# Patient Record
Sex: Male | Born: 1960 | Race: White | Hispanic: No | Marital: Married | Smoking: Never smoker
Health system: Southern US, Community
[De-identification: ages and names within clinical notes are randomized; demographics above are authoritative.]

## PROBLEM LIST (undated history)

## (undated) DIAGNOSIS — K219 Gastro-esophageal reflux disease without esophagitis: Secondary | ICD-10-CM

## (undated) DIAGNOSIS — L309 Dermatitis, unspecified: Secondary | ICD-10-CM

## (undated) HISTORY — DX: Dermatitis, unspecified: L30.9

## (undated) HISTORY — PX: KNEE ARTHROSCOPY: SUR90

## (undated) HISTORY — PX: HERNIA REPAIR: SHX51

## (undated) HISTORY — DX: Gastro-esophageal reflux disease without esophagitis: K21.9

---
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
## 2007-08-05 ENCOUNTER — Ambulatory Visit (HOSPITAL_BASED_OUTPATIENT_CLINIC_OR_DEPARTMENT_OTHER): Admission: RE | Admit: 2007-08-05 | Discharge: 2007-08-05 | Payer: Self-pay | Admitting: Orthopedic Surgery

## 2009-05-10 HISTORY — PX: INGUINAL HERNIA REPAIR: SUR1180

## 2010-11-13 NOTE — Op Note (Signed)
NAME:  Rodney Weaver, Rodney Weaver              ACCOUNT NO.:  [id number]   MEDICAL RECORD NO.:  [id number]          PATIENT TYPE:  AMB   LOCATION:  DSC                          FACILITY:  [facility]   PHYSICIAN:  Mila Homer. Sherlean Foot, M.D. DATE OF BIRTH:  01/29/1961   DATE OF PROCEDURE:  08/05/2007  DATE OF DISCHARGE:                               OPERATIVE REPORT   SURGEON:  Mila Homer. Sherlean Foot, M.D.   ASSISTANT:  None.   ANESTHESIA:  MAC.   PREOPERATIVE DIAGNOSIS:  Right knee medial meniscus tear and plica  syndrome.   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   PROCEDURE:  Right knee arthroscopy, partial medial meniscectomy, plica  resection.   INDICATIONS FOR PROCEDURE:  The patient is a 50 year old white male with  mechanical symptoms and MRI evidence of the medial meniscus tear.  Informed consent was obtained.   DESCRIPTION OF PROCEDURE:  The patient was laid supine and administered  MAC anesthesia.  Right leg was prepped and draped in usual sterile  fashion.  Inferolateral and inferomedial portals were created with a #11  blade, blunt trocar, and cannula.  Diagnostic arthroscopy revealed no  chondromalacia in the joint at all.  There was a large peripatellar  hyperemic synovitic plica.  This was debrided with the [facility]  shaver, and ArthroCare debridement wand was used to cauterized bleeding  vessels.  Went into flexion, and the ACL and PCL were normal.  The  medial compartment looked excellent.  I then probed the undersurface of  the meniscus, found meniscus tear about mid body on the undersurface,  certainly grade 3 tearing.  I used a straight basket forceps and a [place] shaver to perform a partial medial meniscectomy of approximately  10-15% of the meniscus.  I then went into figure-four position, and  lateral part was completely normal.  I then lavaged and closed with 4-0  nylon sutures, dressed with Xeroform, dressing, sponges, sterile  Webril,  and Ace wrap.   COMPLICATIONS:  None.   DRAINS:  None.           ______________________________  Mila Homer. Sherlean Foot, M.D.     SDL/MEDQ  D:  08/05/2007  T:  08/06/2007  Job:  [id number]

## 2011-03-22 LAB — BASIC METABOLIC PANEL
BUN: 7
CO2: 28
Chloride: 102
Glucose, Bld: 101 — ABNORMAL HIGH
Potassium: 4.3

## 2011-03-22 LAB — POCT HEMOGLOBIN-HEMACUE: Hemoglobin: 14.3

## 2011-11-20 HISTORY — PX: AV FISTULA REPAIR: SHX563

## 2016-09-24 ENCOUNTER — Ambulatory Visit: Payer: Self-pay | Admitting: Pediatrics

## 2016-10-07 ENCOUNTER — Encounter: Payer: Self-pay | Admitting: Pediatrics

## 2016-10-07 ENCOUNTER — Ambulatory Visit (INDEPENDENT_AMBULATORY_CARE_PROVIDER_SITE_OTHER): Payer: BLUE CROSS/BLUE SHIELD | Admitting: Pediatrics

## 2016-10-07 VITALS — BP 158/104 | HR 71 | Temp 98.3°F | Resp 16 | Ht 67.91 in | Wt 212.3 lb

## 2016-10-07 DIAGNOSIS — J4530 Mild persistent asthma, uncomplicated: Secondary | ICD-10-CM | POA: Diagnosis not present

## 2016-10-07 DIAGNOSIS — J3089 Other allergic rhinitis: Secondary | ICD-10-CM | POA: Diagnosis not present

## 2016-10-07 DIAGNOSIS — L2089 Other atopic dermatitis: Secondary | ICD-10-CM | POA: Diagnosis not present

## 2016-10-07 DIAGNOSIS — L500 Allergic urticaria: Secondary | ICD-10-CM

## 2016-10-07 DIAGNOSIS — K219 Gastro-esophageal reflux disease without esophagitis: Secondary | ICD-10-CM

## 2016-10-07 DIAGNOSIS — Z79899 Other long term (current) drug therapy: Secondary | ICD-10-CM | POA: Diagnosis not present

## 2016-10-07 DIAGNOSIS — I10 Essential (primary) hypertension: Secondary | ICD-10-CM | POA: Diagnosis not present

## 2016-10-07 MED ORDER — MONTELUKAST SODIUM 10 MG PO TABS: 10.0000 mg | ORAL_TABLET | Freq: Every day | ORAL | 5 refills | Status: AC

## 2016-10-07 MED ORDER — ALBUTEROL SULFATE HFA 108 (90 BASE) MCG/ACT IN AERS: 2.0000 | INHALATION_SPRAY | RESPIRATORY_TRACT | 1 refills | Status: AC | PRN

## 2016-10-07 MED ORDER — FAMOTIDINE 20 MG PO TABS: 20.0000 mg | ORAL_TABLET | Freq: Two times a day (BID) | ORAL | 5 refills | Status: AC

## 2016-10-07 MED ORDER — AZELASTINE-FLUTICASONE 137-50 MCG/ACT NA SUSP: NASAL | 5 refills | Status: AC

## 2016-10-07 NOTE — Progress Notes (Signed)
[address] [place] [address] Dept: [phone]  New Patient Note  Patient ID: Rodney Weaver, male    DOB: 03-01-1961  Age: 56 y.o. MRN: [id number] Date of Office Visit: 10/07/2016 Referring provider: Cheron Schaumann, MD [address] [place], [place] [address]    Chief Complaint: Asthma (as a child. hasn't used inhalers for a long timeA); Angioedema (intermittently for severals years. most recent episode of swelling started this past Friday started off with hand swelling and yesterday his feet started swelling.); and Advice Only (2cd opinion. presently on immunotherapy at [facility] health care. Hasn't had an injection since December)  HPI Rodney Weaver presents for evaluation of angioedema since December 2017. He did have one episode of swelling of his tongue. He has a history of itching before the swelling occurs. He may have had similar symptoms in childhood. He has been on allergy injections for over 10 years because of asthma and rhinitis. He has a history of eczema for 20 years. He has aggravation of his nasal congestion and cough with exposure to dust and cigarette smoke. 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 During the past 3 days he has had chills and some sweating but no actual fever. He has had some angioedema and itching during the past 3 days. Azelastine nasal spray has not been very helpful. He uses Dymista if needed for stuffy nose.  Review of Systems  Constitutional:       Chills and sweating for 3 days but no fever  HENT:       Nasal congestion for several years. On allergy injections for over 10 years  Eyes: Negative.   Respiratory:       History of a severe asthma as a child  Cardiovascular:       Hypertension  Gastrointestinal:       Heartburn at times  Genitourinary: Negative.   Musculoskeletal: Negative.   Skin:       Angioedema and itching of his skin worse  since December 2017. History of  eczema  Neurological: Negative.   Endo/Heme/Allergies:       No diabetes. He has Hypothyroidism  Psychiatric/Behavioral: Negative.     Outpatient Encounter Prescriptions as of 10/07/2016  Medication Sig  . Ascorbic Acid (VITAMIN C PO) Take 500 mg by mouth.  . Azelastine-Fluticasone 137-50 MCG/ACT SUSP 1 spray by Each Nare route daily as needed.  . cetirizine (ZYRTEC) 10 MG tablet Take 10 mg by mouth.  . clindamycin (CLEOCIN T) 1 % external solution   . clobetasol (TEMOVATE) 0.05 % external solution   . diphenhydrAMINE (BENADRYL) 25 mg capsule Take 25 mg by mouth.  . EPINEPHrine (EPIPEN 2-PAK) 0.3 mg/0.3 mL IJ SOAJ injection   . famotidine (PEPCID) 20 MG tablet Take 20 mg by mouth.  . Garlic 1000 MG CAPS Take by mouth.  . levothyroxine (SYNTHROID, LEVOTHROID) 100 MCG tablet Take by mouth.  . Multiple Vitamins-Minerals (MULTIVITAMIN ADULTS PO) Take by mouth.  . Nebivolol HCl 20 MG TABS Take 20 mg by mouth.  . sildenafil (VIAGRA) 100 MG tablet Take one tablet by mouth 30 minutes prior to sex.  . valsartan (DIOVAN) 320 MG tablet Take 320 mg by mouth.  Marland Kitchen albuterol (PROVENTIL HFA) 108 (90 Base) MCG/ACT inhaler Inhale 2 puffs into the lungs every 4 (four) hours as needed for wheezing or shortness of breath.  . Azelastine-Fluticasone 137-50 MCG/ACT SUSP 1 spray per nostril 2 times a  day  . famotidine (PEPCID) 20 MG tablet Take 1 tablet (20 mg total) by mouth 2 (two) times daily.  . montelukast (SINGULAIR) 10 MG tablet Take 1 tablet (10 mg total) by mouth at bedtime.   No facility-administered encounter medications on file as of 10/07/2016.      Drug Allergies:  Allergies  Allergen Reactions  . Benzonatate Swelling  . Clindamycin Swelling    Facial Swelling with Oral Medication Only Topical Clindamycin is Tolerated by the Patient  . Desloratadine Swelling  . Diltiazem Swelling  . Levofloxacin Swelling  . Valsartan-Hydrochlorothiazide Swelling  . Ace Inhibitors     Arb inhibitors causes  swelling.  . Diovan [Valsartan]     swelling  . Clonidine Nausea Only    Family History: Square's family history is not on file.. Family history is positive for allergic rhinitis and eczema but negative for asthma, sinus problems, angioedema, hives, food allergies, chronic bronchitis or emphysema.  Social and environmental. There is a dog in the home. He is not exposed to cigarette smoking. He has never smoked cigarettes. 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  Physical Exam: BP (!) 158/104   Pulse 71   Temp 98.3 F (36.8 C) (Oral)   Resp 16   Ht 5' 7.91" (1.725 m)   Wt 212 lb 4.9 oz (96.3 kg)   SpO2 98%   BMI 32.36 kg/m    Physical Exam  Constitutional: He is oriented to person, place, and time. He appears well-developed and well-nourished.  HENT:  Eyes normal. Ears normal. Nose mild swelling of turbinates. Pharynx normal.  Neck: Neck supple. No thyromegaly present.  Cardiovascular:  S1 and S2 normal no murmurs  Pulmonary/Chest:  Clear to percussion and auscultation  Abdominal: Soft. There is no tenderness (no hepatosplenomegaly).  Lymphadenopathy:    He has no cervical adenopathy.  Neurological: He is alert and oriented to person, place, and time.  Skin:  Mild swelling in the lower eyelids. The swollen area in the left wrist. Dermographia  Psychiatric: He has a normal mood and affect. His behavior is normal. Thought content normal.  Vitals reviewed.   Diagnostics: FVC 2.87 L FEV1 2.80 L. Predicted FVC 4.66 L predicted FEV1 3.60 L. After albuterol 2 puffs FVC 4.43 L FEV1 3.63 L-this shows a moderate reduction in the forced vital capacity. He has had asthma since early childhood which was quite severe. He has mild airway obstruction.   Allergy skin testing showed a slight reactivity to a common indoor mold. Skin testing to foods was negative   Assessment  Assessment and Plan: 1. Mild persistent asthma without  complication   2. Other allergic rhinitis   3. Allergic urticaria   4. Flexural atopic dermatitis   5. Essential hypertension   6. Current use of beta blocker   7. Gastroesophageal reflux disease without esophagitis     Meds ordered this encounter  Medications  . montelukast (SINGULAIR) 10 MG tablet    Sig: Take 1 tablet (10 mg total) by mouth at bedtime.    Dispense:  30 tablet    Refill:  5    For cough or wheeze.  Marland Kitchen albuterol (PROVENTIL HFA) 108 (90 Base) MCG/ACT inhaler    Sig: Inhale 2 puffs into the lungs every 4 (four) hours as needed for wheezing or shortness of breath.    Dispense:  1 Inhaler    Refill:  1  .  famotidine (PEPCID) 20 MG tablet    Sig: Take 1 tablet (20 mg total) by mouth 2 (two) times daily.    Dispense:  60 tablet    Refill:  5    For reflux  . Azelastine-Fluticasone 137-50 MCG/ACT SUSP    Sig: 1 spray per nostril 2 times a day    Dispense:  1 Bottle    Refill:  5    For runny nose. Please keep rx on file. Pt. Will call when needed.    Patient Instructions  Environmental control of dust and mold Zyrtec 10 mg once or twice a day for 4 itching Dymista 1 spray per nostril twice a day if needed for stuffy nose or sinus headache Montelukast  10 mg once a day for coughing or wheezing Proventil 2 puffs every 4 hours if needed for wheezing or coughing spells Continue Pepcid 20 mg twice a day for heartburn. May help the itching Add  prednisone 10 mg twice a day for 4 days, 10 mg on the fifth day to bring your allergic reaction under control Call me if you're not doing well on this treatment plan Stop allergy injections  If you have an allergic reaction take Benadryl 50 mg every 4 hours, and if you have life-threatening symptoms inject  with EpiPen 0.3 mg. Then write what you had to eat or drink in the previous 4 hours  Call your family doctor to change your blood pressure medications from valsartan to non-ACE inhibitor and a non-ARB inhibitor. In patients  who have had angioedema from ACE inhibitor's, about 10% can have angioedema from an ARB inhibitor   Return in about 4 weeks (around 11/04/2016).   Thank you for the opportunity to care for this patient.  Please do not hesitate to contact me with questions.  Tonette Bihari, M.D.  [facility] [address] [place], [place] [address] [phone]

## 2016-10-07 NOTE — Patient Instructions (Addendum)
Environmental control of dust and mold Zyrtec 10 mg once or twice a day for 4 itching Dymista 1 spray per nostril twice a day if needed for stuffy nose or sinus headache Montelukast  10 mg once a day for coughing or wheezing Proventil 2 puffs every 4 hours if needed for wheezing or coughing spells Continue Pepcid 20 mg twice a day for heartburn. May help the itching Add  prednisone 10 mg twice a day for 4 days, 10 mg on the fifth day to bring your allergic reaction under control Call me if you're not doing well on this treatment plan Stop allergy injections  If you have an allergic reaction take Benadryl 50 mg every 4 hours, and if you have life-threatening symptoms inject  with EpiPen 0.3 mg. Then write what you had to eat or drink in the previous 4 hours  Call your family doctor to change your blood pressure medications from valsartan to non-ACE inhibitor and a non-ARB inhibitor. In patients who have had angioedema from ACE inhibitor's, about 10% can have angioedema from an ARB inhibitor

## 2016-10-18 ENCOUNTER — Telehealth: Payer: Self-pay | Admitting: *Deleted

## 2016-10-18 NOTE — Telephone Encounter (Signed)
Pt states that he hasn't had any problems since his last ov until today. His tongue is swelling and he is not sure what's causing it. Would like a return call today

## 2016-10-18 NOTE — Telephone Encounter (Signed)
Tried to leave message for pt, got his voicemail box that was full and couldn't receive messages

## 2016-10-25 ENCOUNTER — Telehealth: Payer: Self-pay

## 2016-10-25 NOTE — Telephone Encounter (Signed)
Called pt.to let him know to increase his zyrtec to 20 mg twice daily and to stay on the bystolic. Told pt. Even though he has stopped the diovan he still can have breakthrough swelling episodes for months to years  after stopping the medication. Pt. Declined to come in next week with Dr. Beaulah Dinning bc he has an appointment already With him on May 21st.

## 2016-10-25 NOTE — Telephone Encounter (Signed)
Pt. Calling to let us know he is still having swelling issues since his visit with Dr. Beaulah Dinning. On April 9th. Pt. States yesterday his bottom lip was swelling and now the left side of his tongue is swelling. Dr. Sebastian Ache had him stop his Diovan but pt. States he is still taking bystolic but did not see on his med list. Please advise

## 2016-10-25 NOTE — Telephone Encounter (Signed)
Reviewed note. He should probably come back and see Dr. Leonard Schwartz. I would recommend to increase his cetirizine to  BID. He should remain on the Bystolic. 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 Even after stopping the medication, you can still have breakthrough swelling episodes for months to years after stopping the medication. He should follow up next week with Dr. B if he can.    Malachi Bonds, MD FAAAAI [facility]

## 2016-10-25 NOTE — Telephone Encounter (Signed)
Told pt. To increase his zyrtec  twice daily and to continue on his bystolic told pt. He may have intermittent swelling episodes for months to years after stopping his diovan per Dr. Dellis Anes. Pt. Declined an appointment this up coming week with Dr. Beaulah Dinning bc he has an appointment already with Dr. Beaulah Dinning on May 21.

## 2016-10-25 NOTE — Telephone Encounter (Signed)
Lm for pt to call us back about this matter 

## 2016-11-18 ENCOUNTER — Ambulatory Visit: Payer: BLUE CROSS/BLUE SHIELD | Admitting: Pediatrics

## 2018-02-13 ENCOUNTER — Other Ambulatory Visit: Payer: Self-pay | Admitting: Chiropractic Medicine

## 2018-02-13 DIAGNOSIS — M25552 Pain in left hip: Secondary | ICD-10-CM

## 2021-12-12 ENCOUNTER — Other Ambulatory Visit: Payer: Self-pay | Admitting: Chiropractic Medicine

## 2021-12-12 ENCOUNTER — Ambulatory Visit
Admission: RE | Admit: 2021-12-12 | Discharge: 2021-12-12 | Disposition: A | Discharge status: Home / Self Care | Payer: 59 | Source: Ambulatory Visit | Attending: Chiropractic Medicine | Admitting: Chiropractic Medicine

## 2021-12-12 DIAGNOSIS — M25612 Stiffness of left shoulder, not elsewhere classified: Secondary | ICD-10-CM

## 2021-12-12 DIAGNOSIS — M25611 Stiffness of right shoulder, not elsewhere classified: Secondary | ICD-10-CM

## 2023-05-25 IMAGING — CR DG SHOULDER 2+V*L*
3 series · 3 of 3 positions shown · non-contrast
Comparison: None Available.

CLINICAL DATA: Decreased range of motion and persistent pain of the
bilateral shoulders.

EXAM:
LEFT SHOULDER - 2+ VIEW

[w shoulder grashey left]
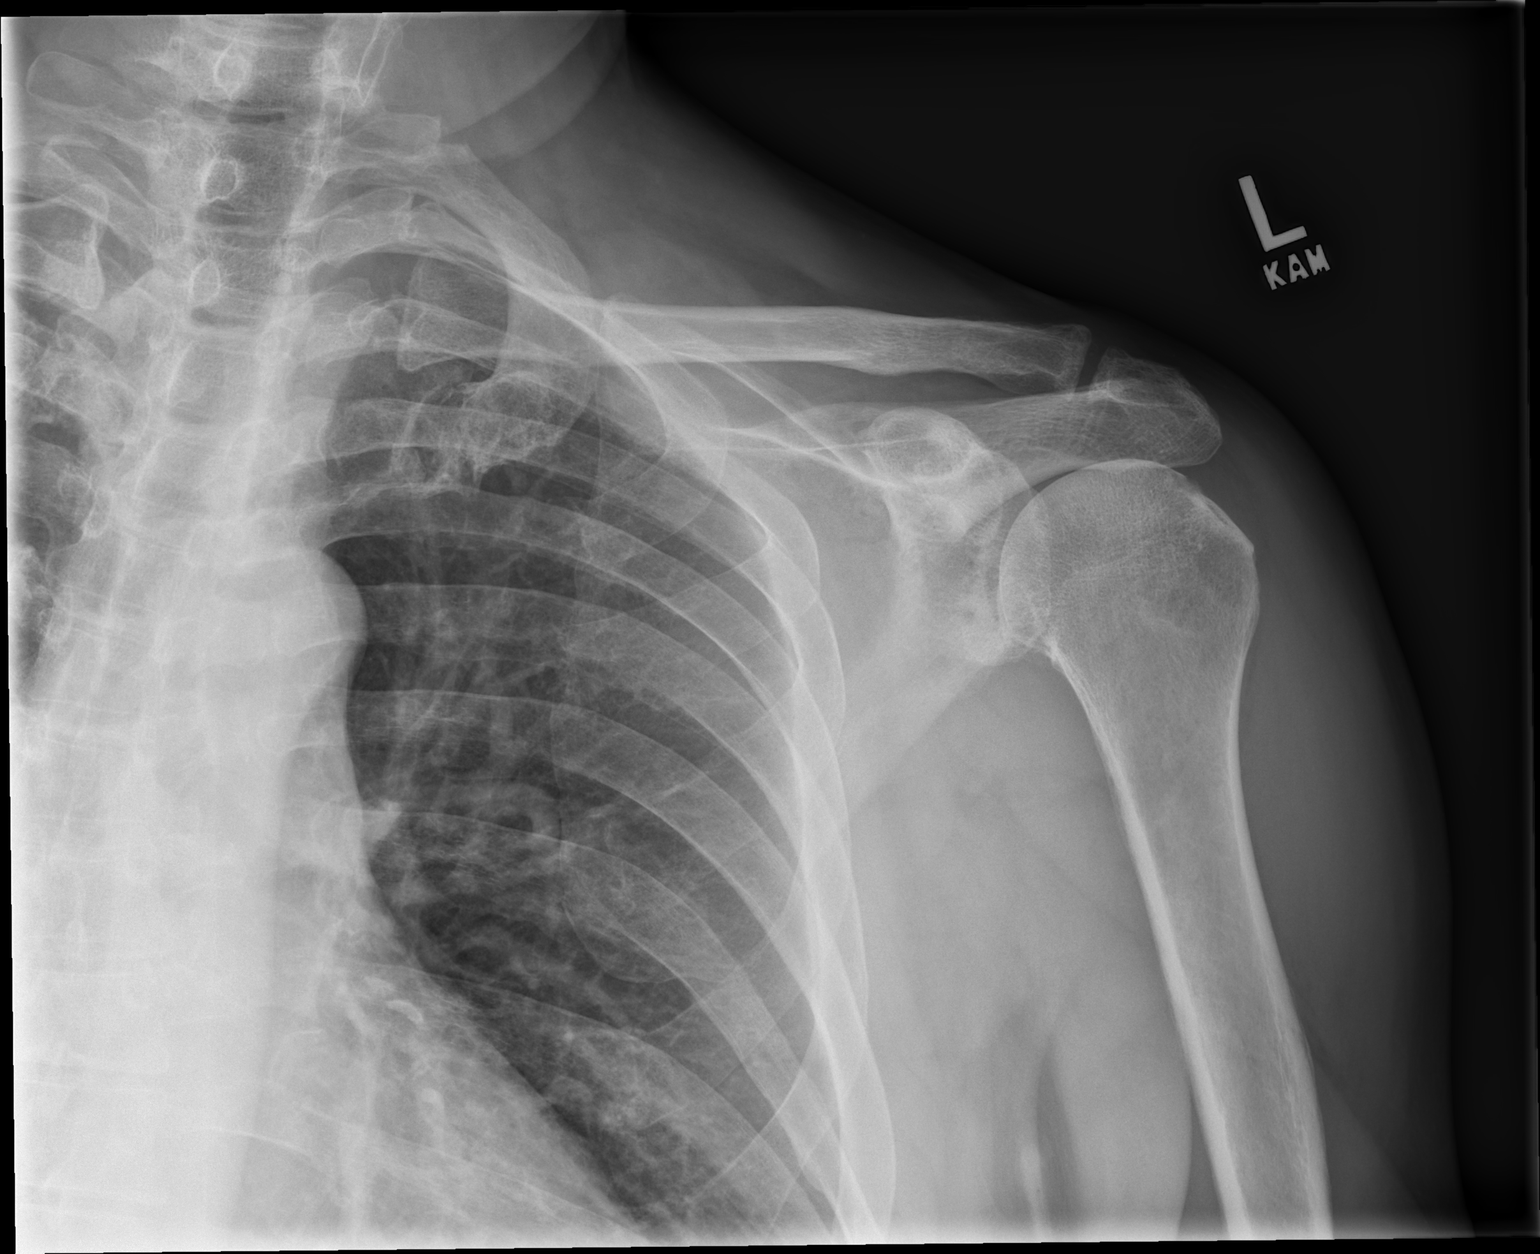

[w shoulder y-view left]
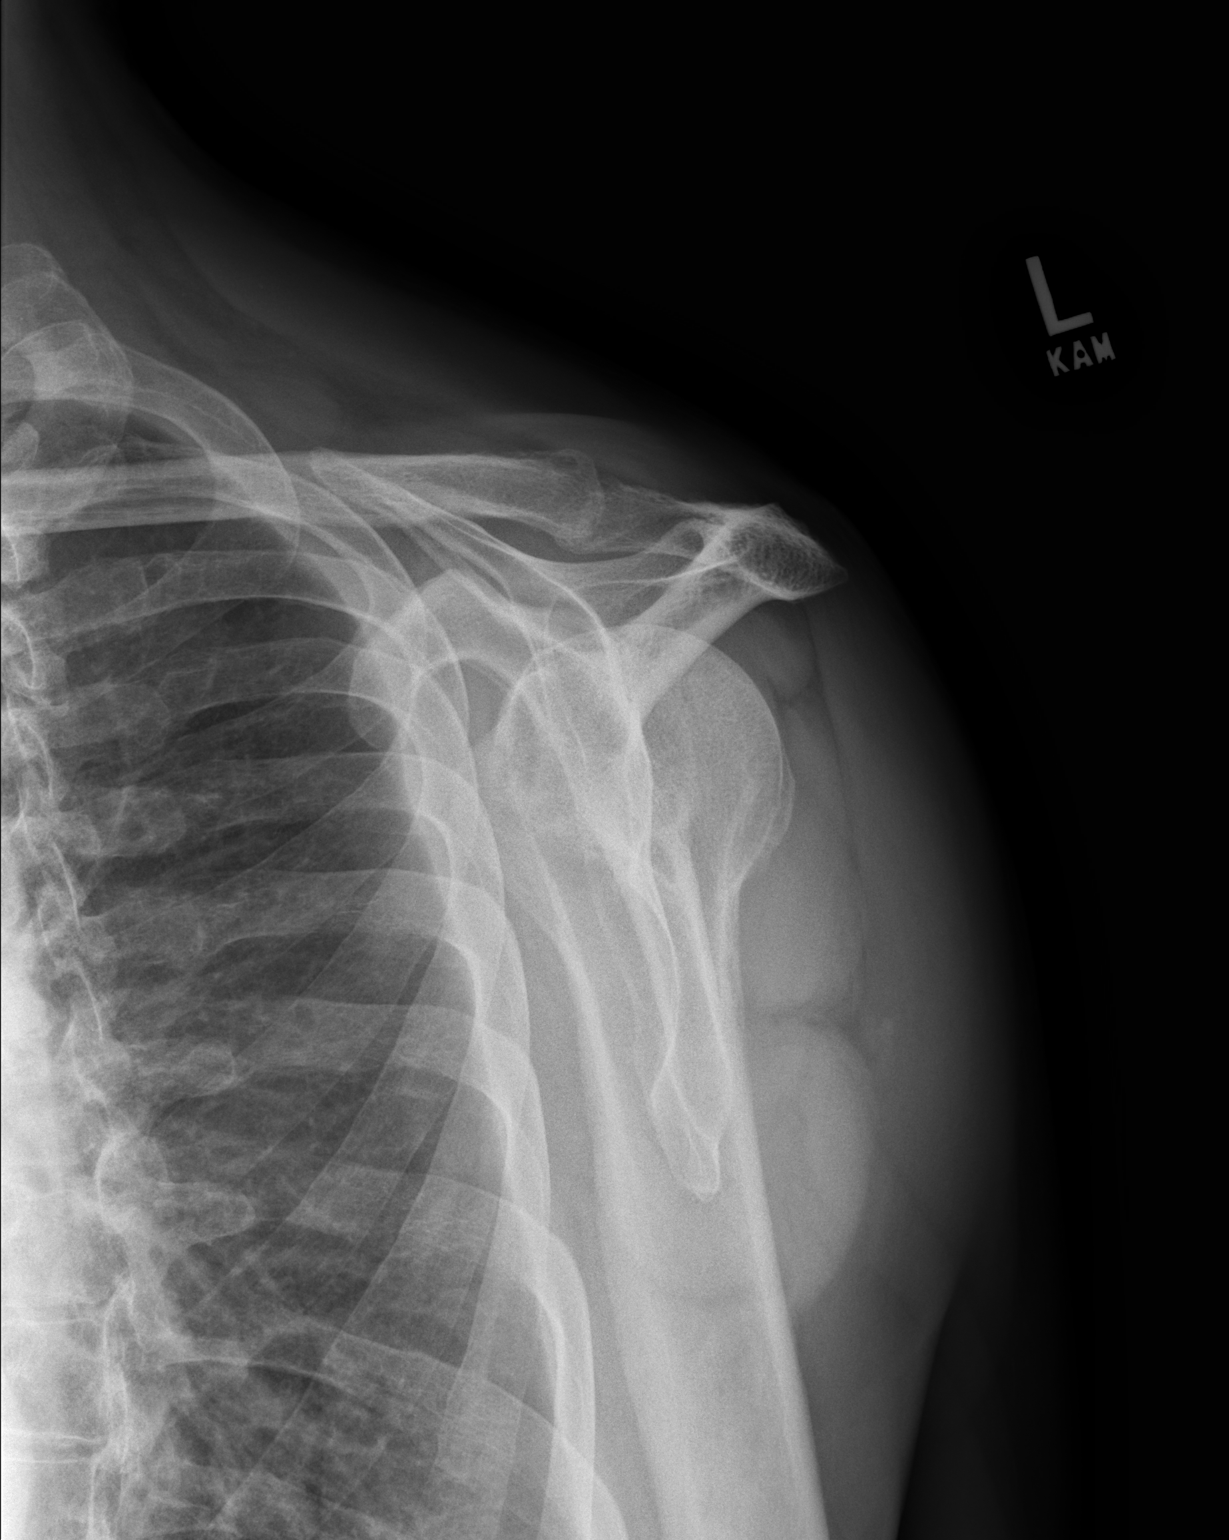

[w shoulder axillary left]
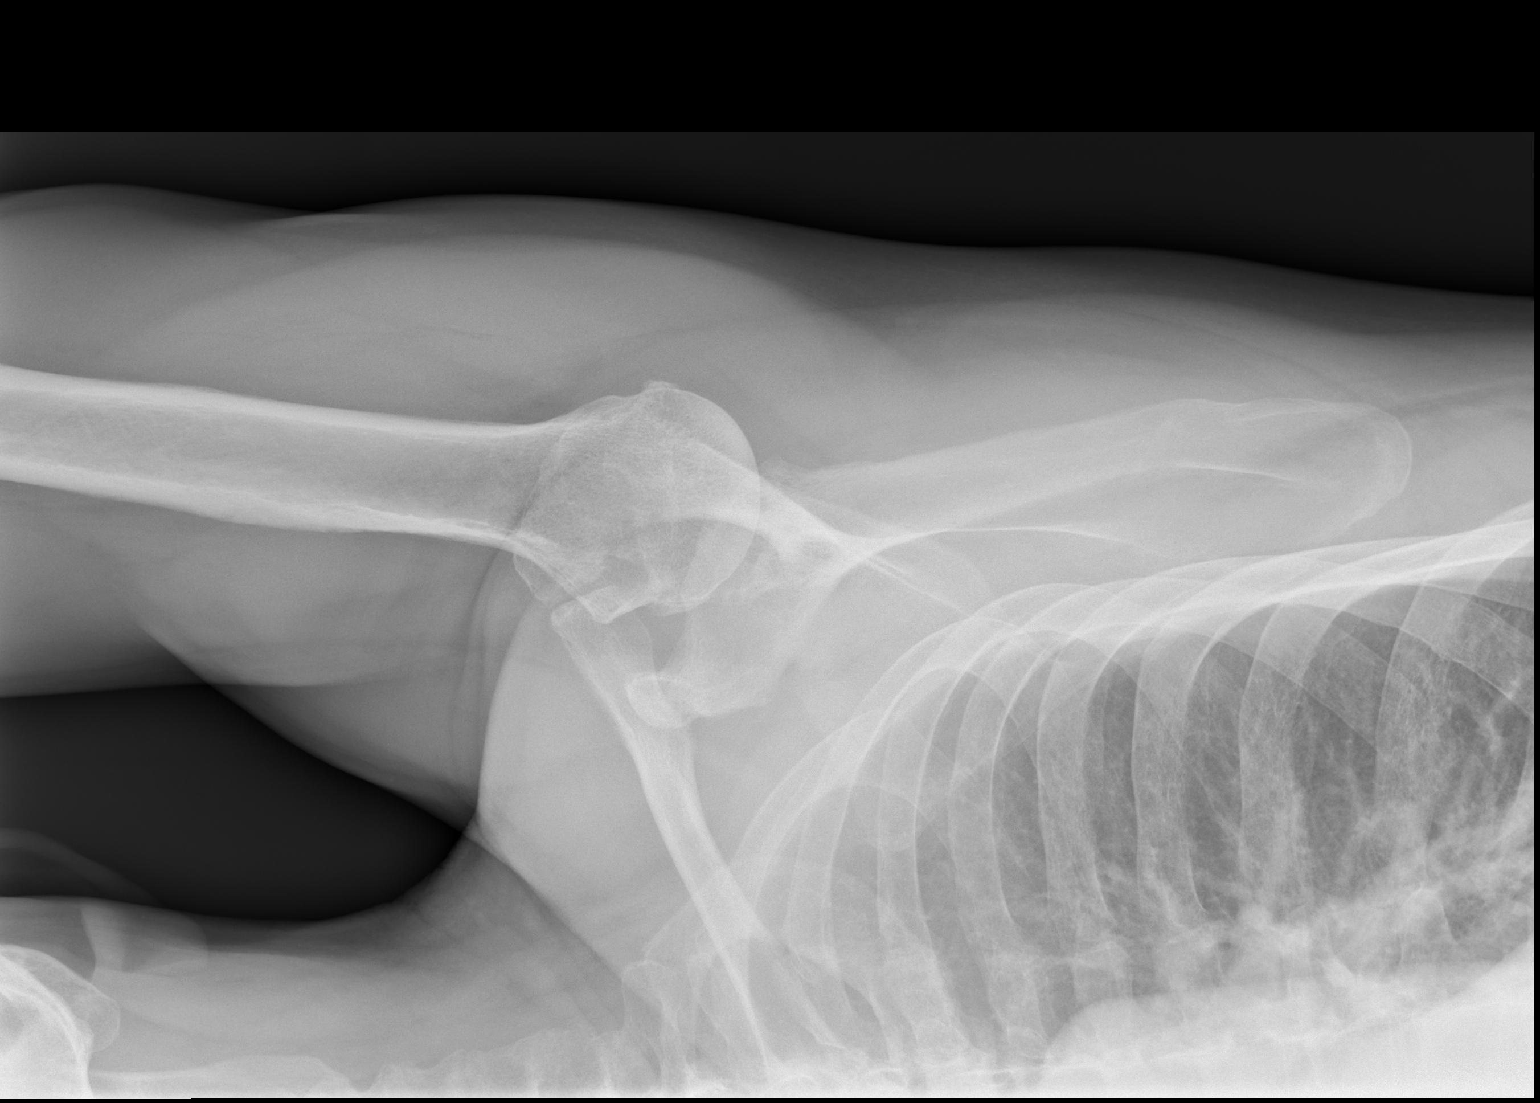

[3 of 3 positions shown; findings below may reference images not displayed]

FINDINGS: There is severe inferior glenohumeral joint space narrowing with
subchondral degenerative cystic change and subchondral sclerosis.

Mild peripheral humeral head articular surface degenerative
osteophytosis.

No significant degenerative change of the left acromioclavicular
joint. No acute fracture is seen. No dislocation. The visualized
portion of the left lung is unremarkable.
IMPRESSION: 1. Moderate to severe glenohumeral osteoarthritis.
2. No acute fracture.
